# Patient Record
Sex: Female | Born: 1960 | Race: Black or African American | Hispanic: No
Health system: Southern US, Community
[De-identification: ages and names within clinical notes are randomized; demographics above are authoritative.]

---

## 2006-12-24 ENCOUNTER — Ambulatory Visit: Payer: Self-pay | Admitting: Specialist

## 2007-01-28 ENCOUNTER — Ambulatory Visit: Payer: Self-pay | Admitting: Neurosurgery

## 2007-03-04 ENCOUNTER — Ambulatory Visit (HOSPITAL_COMMUNITY): Admission: RE | Admit: 2007-03-04 | Discharge: 2007-03-05 | Payer: Self-pay | Admitting: Neurosurgery

## 2009-05-15 ENCOUNTER — Ambulatory Visit: Payer: Self-pay

## 2010-09-03 ENCOUNTER — Ambulatory Visit: Payer: Self-pay | Admitting: Family Medicine

## 2011-09-02 ENCOUNTER — Emergency Department: Payer: Self-pay | Admitting: Emergency Medicine

## 2012-09-06 ENCOUNTER — Ambulatory Visit: Payer: Self-pay | Admitting: Family Medicine

## 2013-06-02 ENCOUNTER — Ambulatory Visit: Payer: Self-pay | Admitting: Internal Medicine

## 2013-06-20 ENCOUNTER — Inpatient Hospital Stay: Payer: Self-pay | Admitting: Surgery

## 2013-06-20 LAB — COMPREHENSIVE METABOLIC PANEL
Alkaline Phosphatase: 162 U/L — ABNORMAL HIGH (ref 50–136)
BUN: 8 mg/dL (ref 7–18)
Bilirubin,Total: 0.4 mg/dL (ref 0.2–1.0)
Calcium, Total: 9.1 mg/dL (ref 8.5–10.1)
Chloride: 107 mmol/L (ref 98–107)
Co2: 29 mmol/L (ref 21–32)
Creatinine: 0.77 mg/dL (ref 0.60–1.30)
EGFR (Non-African Amer.): >60
Glucose: 86 mg/dL (ref 65–99)
Osmolality: 273 (ref 275–301)

## 2013-06-20 LAB — CBC
MCV: 78 fL — ABNORMAL LOW (ref 80–100)
RBC: 5.13 10*6/uL (ref 3.80–5.20)
RDW: 14.1 % (ref 11.5–14.5)

## 2013-06-20 LAB — URINALYSIS, COMPLETE
Blood: NEGATIVE
RBC,UR: 1 /HPF (ref 0–5)

## 2013-06-20 LAB — LIPASE, BLOOD: Lipase: 128 U/L (ref 73–393)

## 2013-06-21 LAB — CBC WITH DIFFERENTIAL/PLATELET
Basophil #: 0 10*3/uL (ref 0.0–0.1)
Basophil %: 0.4 %
Eosinophil #: 0.2 10*3/uL (ref 0.0–0.7)
HCT: 35.1 % (ref 35.0–47.0)
Lymphocyte %: 32.6 %
MCH: 27.1 pg (ref 26.0–34.0)
Monocyte #: 0.8 x10 3/mm (ref 0.2–0.9)
Monocyte %: 8.7 %
RBC: 4.51 10*6/uL (ref 3.80–5.20)
WBC: 9.5 10*3/uL (ref 3.6–11.0)

## 2013-06-21 LAB — COMPREHENSIVE METABOLIC PANEL
Alkaline Phosphatase: 142 U/L — ABNORMAL HIGH (ref 50–136)
Bilirubin,Total: 0.4 mg/dL (ref 0.2–1.0)
EGFR (African American): >60
Glucose: 81 mg/dL (ref 65–99)
Potassium: 3.6 mmol/L (ref 3.5–5.1)
SGOT(AST): 20 U/L (ref 15–37)
SGPT (ALT): 18 U/L (ref 12–78)
Total Protein: 6.7 g/dL (ref 6.4–8.2)

## 2013-06-22 LAB — COMPREHENSIVE METABOLIC PANEL
Alkaline Phosphatase: 138 U/L — ABNORMAL HIGH (ref 50–136)
BUN: 5 mg/dL — ABNORMAL LOW (ref 7–18)
Bilirubin,Total: 0.5 mg/dL (ref 0.2–1.0)
Chloride: 102 mmol/L (ref 98–107)
Co2: 29 mmol/L (ref 21–32)
EGFR (Non-African Amer.): >60
Osmolality: 272 (ref 275–301)
Potassium: 3.6 mmol/L (ref 3.5–5.1)
SGPT (ALT): 32 U/L (ref 12–78)
Sodium: 138 mmol/L (ref 136–145)
Total Protein: 6.9 g/dL (ref 6.4–8.2)

## 2013-06-22 LAB — CBC WITH DIFFERENTIAL/PLATELET
Basophil #: 0.1 10*3/uL (ref 0.0–0.1)
Basophil %: 0.5 %
Eosinophil %: 0.6 %
HCT: 36.6 % (ref 35.0–47.0)
MCH: 27 pg (ref 26.0–34.0)
MCHC: 34.8 g/dL (ref 32.0–36.0)
MCV: 77 fL — ABNORMAL LOW (ref 80–100)
Monocyte #: 1.1 x10 3/mm — ABNORMAL HIGH (ref 0.2–0.9)
Monocyte %: 10.4 %
Neutrophil #: 6.8 10*3/uL — ABNORMAL HIGH (ref 1.4–6.5)
Platelet: 234 10*3/uL (ref 150–440)
RBC: 4.72 10*6/uL (ref 3.80–5.20)

## 2013-06-23 LAB — COMPREHENSIVE METABOLIC PANEL
Albumin: 2.5 g/dL — ABNORMAL LOW (ref 3.4–5.0)
Anion Gap: 9 (ref 7–16)
BUN: 6 mg/dL — ABNORMAL LOW (ref 7–18)
Co2: 26 mmol/L (ref 21–32)
Glucose: 81 mg/dL (ref 65–99)
Potassium: 3.4 mmol/L — ABNORMAL LOW (ref 3.5–5.1)
SGOT(AST): 31 U/L (ref 15–37)
SGPT (ALT): 28 U/L (ref 12–78)
Sodium: 135 mmol/L — ABNORMAL LOW (ref 136–145)

## 2013-06-23 LAB — CBC WITH DIFFERENTIAL/PLATELET
HGB: 11.5 g/dL — ABNORMAL LOW (ref 12.0–16.0)
MCH: 26.9 pg (ref 26.0–34.0)
Neutrophil %: 69.3 %
RBC: 4.27 10*6/uL (ref 3.80–5.20)
WBC: 11.8 10*3/uL — ABNORMAL HIGH (ref 3.6–11.0)

## 2013-07-03 ENCOUNTER — Ambulatory Visit: Payer: Self-pay | Admitting: Internal Medicine

## 2013-07-04 ENCOUNTER — Ambulatory Visit: Payer: Self-pay | Admitting: Internal Medicine

## 2013-07-04 LAB — CBC CANCER CENTER
Basophil %: 1.2 %
Eosinophil #: 0.3 x10 3/mm (ref 0.0–0.7)
Eosinophil %: 2.4 %
Lymphocyte %: 26.6 %
MCHC: 34.4 g/dL (ref 32.0–36.0)
MCV: 77 fL — ABNORMAL LOW (ref 80–100)
Monocyte #: 0.9 x10 3/mm (ref 0.2–0.9)
Monocyte %: 8.1 %
Neutrophil #: 6.7 x10 3/mm — ABNORMAL HIGH (ref 1.4–6.5)
Neutrophil %: 61.7 %
Platelet: 622 x10 3/mm — ABNORMAL HIGH (ref 150–440)

## 2013-07-04 LAB — COMPREHENSIVE METABOLIC PANEL
Alkaline Phosphatase: 189 U/L — ABNORMAL HIGH (ref 50–136)
Anion Gap: 9 (ref 7–16)
BUN: 7 mg/dL (ref 7–18)
Bilirubin,Total: 0.4 mg/dL (ref 0.2–1.0)
Calcium, Total: 10.4 mg/dL — ABNORMAL HIGH (ref 8.5–10.1)
Chloride: 100 mmol/L (ref 98–107)
Glucose: 98 mg/dL (ref 65–99)
Osmolality: 270 (ref 275–301)
SGOT(AST): 26 U/L (ref 15–37)
Total Protein: 8.8 g/dL — ABNORMAL HIGH (ref 6.4–8.2)

## 2013-07-05 LAB — CANCER ANTIGEN 19-9: CA 19-9: 6 U/mL (ref 0–35)

## 2013-08-02 ENCOUNTER — Ambulatory Visit: Payer: Self-pay | Admitting: Internal Medicine

## 2014-04-22 IMAGING — CT CT ABD-PELV W/ CM
1 of 3 series · 14 of 32 positions shown, 19 images · IV contrast (isovue)
Comparison: None

REASON FOR EXAM: Gallbladder Carcinoma Cholecystectomy
COMMENTS:

PROCEDURE:     KCT - KCT ABDOMEN/PELVIS W  - July 05, 2013 [DATE]
RESULT:     History: Gallbladder carcinoma
TECHNIQUE: Multiple axial images of the abdomen and pelvis were performed
from the lung bases to the pubic symphysis, with p.o. contrast and with 85
ml of Isovue 300 intravenous contrast.

[Series 2: abd 3mm w 3.0 i40f 3 · axial · 0.90mm/px · z∈[-397,-22]mm · 14 of 141 slices shown, 19 images]
[im 8/141  soft-tissue]
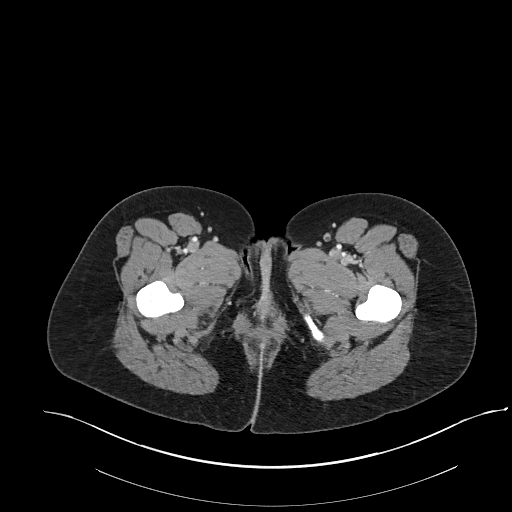
[im 8/141  bone]
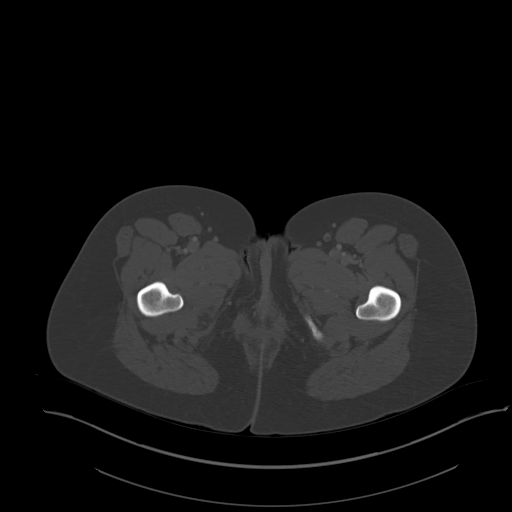
[im 23/141  soft-tissue]
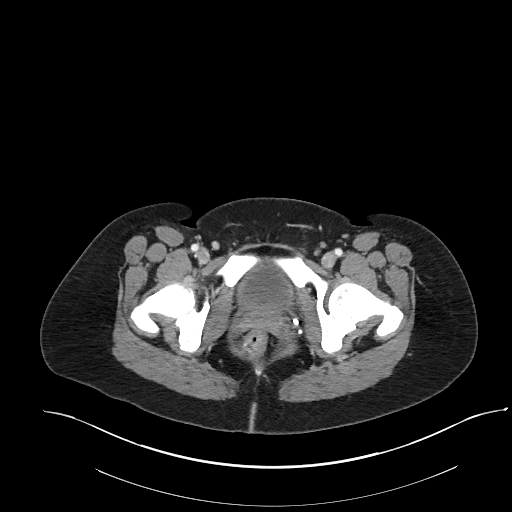
[im 30/141  soft-tissue]
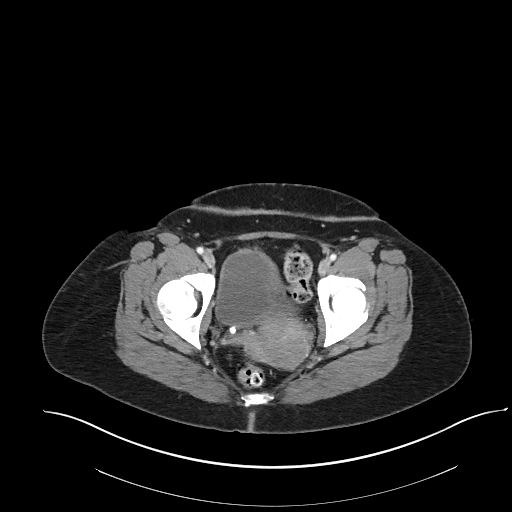
[im 37/141  soft-tissue]
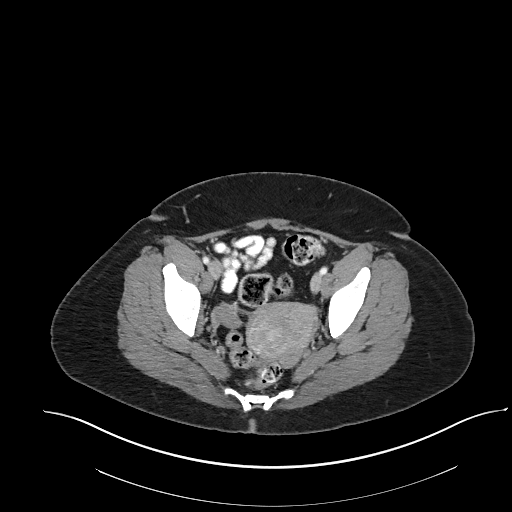
[im 52/141  soft-tissue]
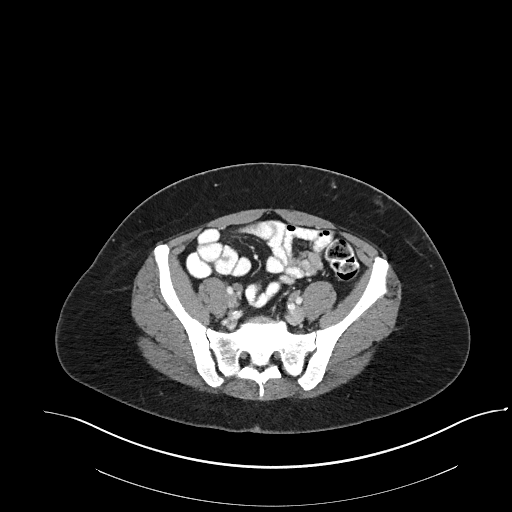
[im 59/141  soft-tissue]
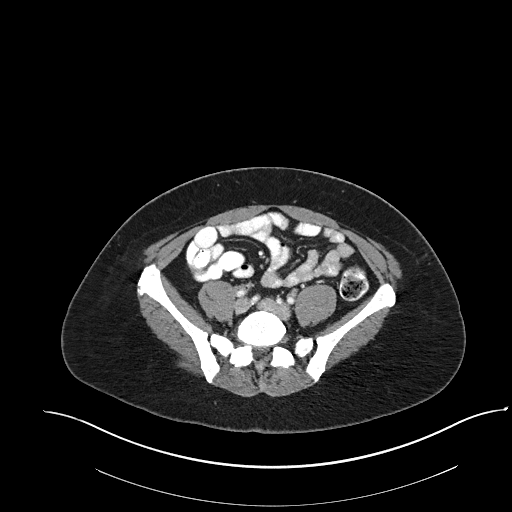
[im 74/141  soft-tissue]
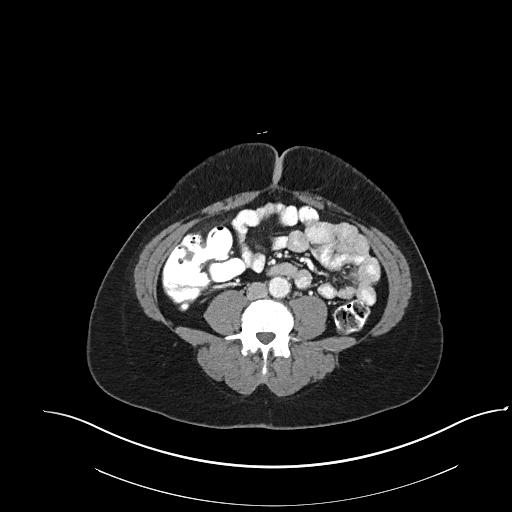
[im 82/141  soft-tissue]
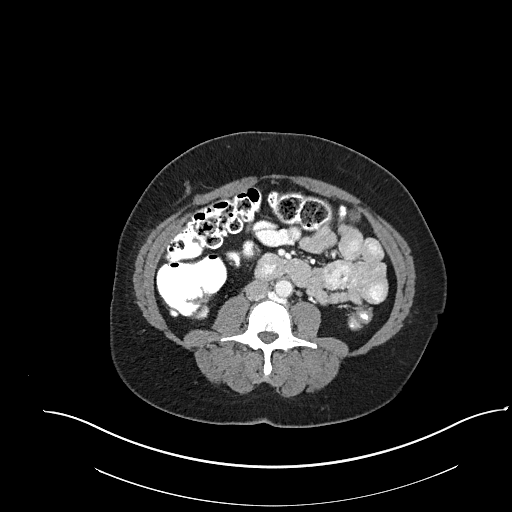
[im 89/141  soft-tissue]
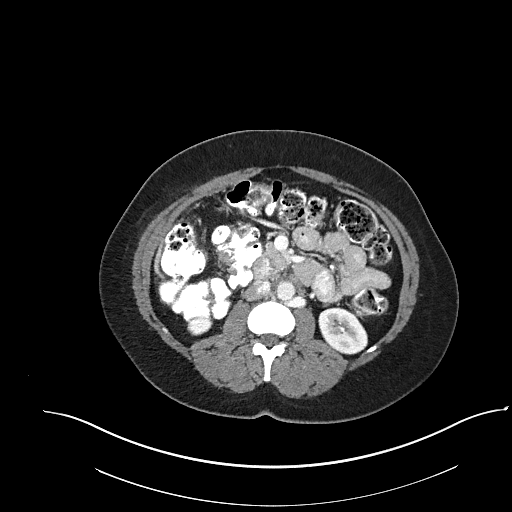
[im 89/141  bone]
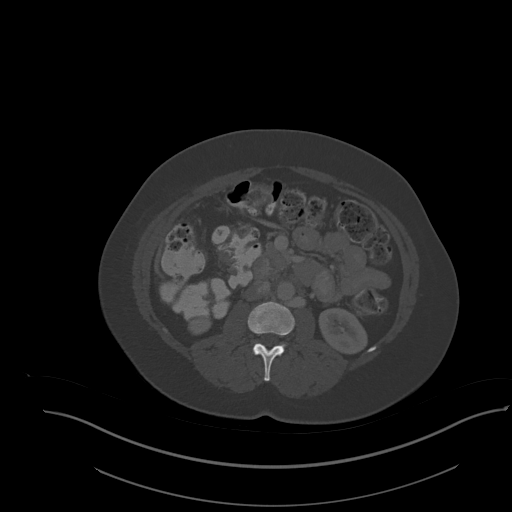
[im 104/141  soft-tissue]
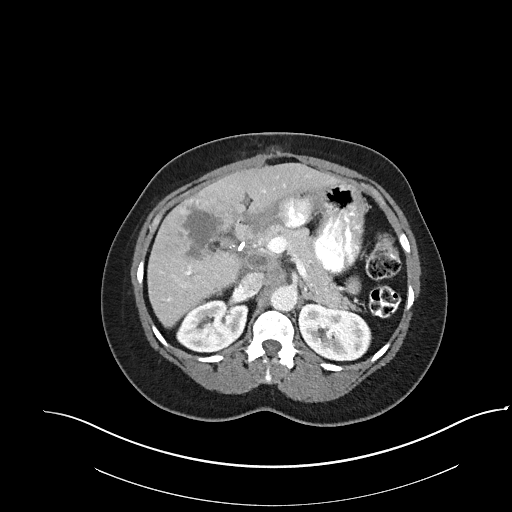
[im 111/141  soft-tissue]
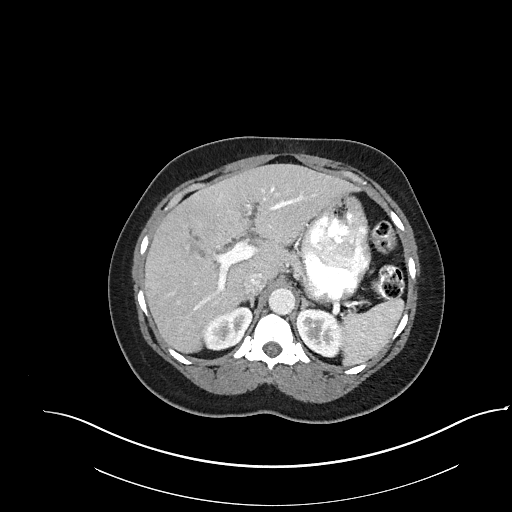
[im 111/141  lung]
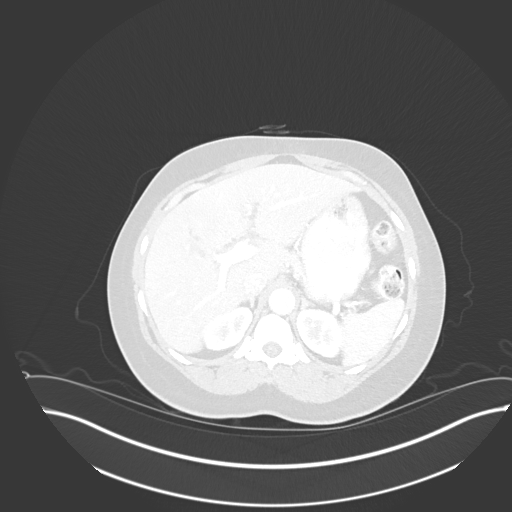
[im 118/141  soft-tissue]
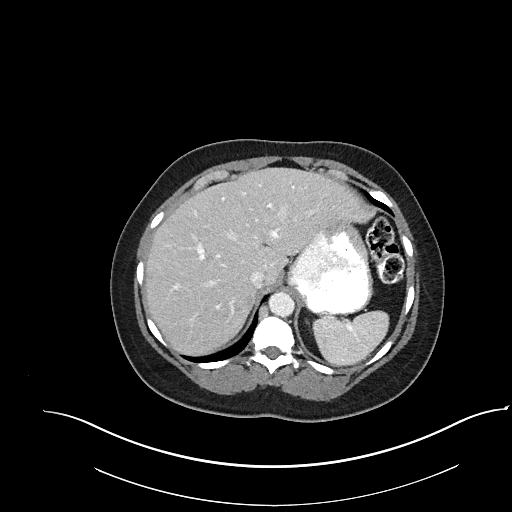
[im 118/141  lung]
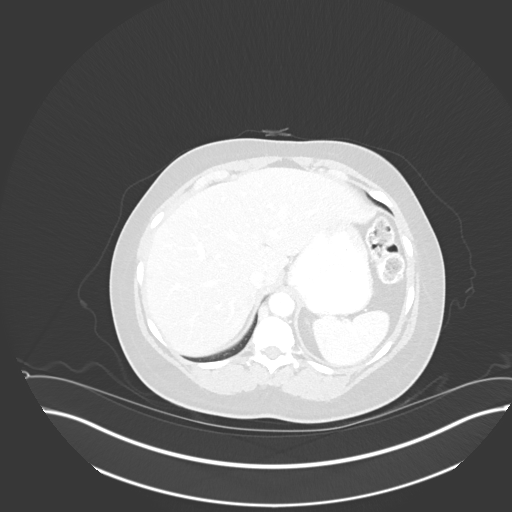
[im 126/141  lung]
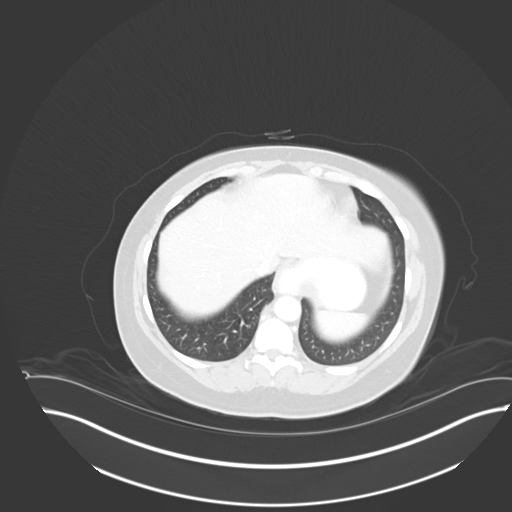
[im 133/141  soft-tissue]
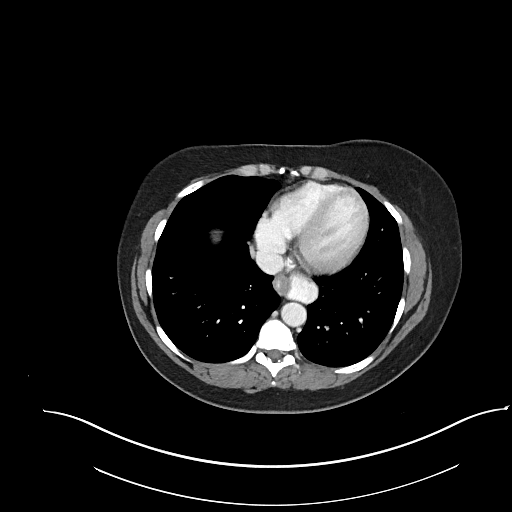
[im 133/141  lung]
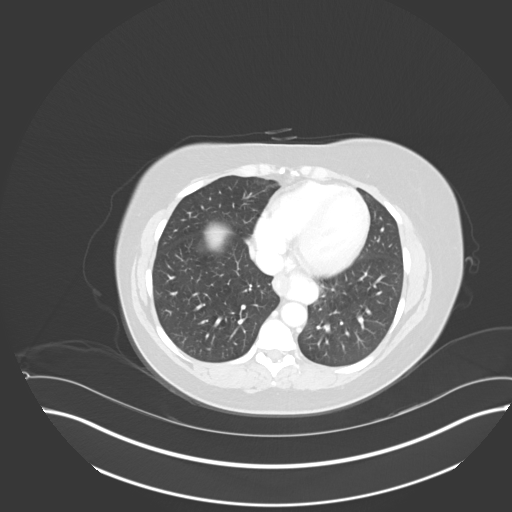

[14 of 32 positions shown; findings below may reference images not displayed]

FINDINGS: The lung bases are clear. There is no pneumothorax. The heart size is
normal.

There is no intrahepatic or extrahepatic biliary ductal dilatation. The
gallbladder is surgically absent. There is a irregular hypodense mass in the
right hepatic lobe adjacent to the gallbladder fossa measuring 4.9 x 3.4 cm
concerning for malignancy versus postoperative fluid collection. The spleen
demonstrates no focal abnormality. There is a 2.4 cm fluid collection along
the lateral aspect of the antrum of the stomach. The kidneys, adrenal
glands, and pancreas are normal. The bladder is unremarkable.

The stomach, duodenum, small intestine, and large intestine demonstrate no
contrast extravasation or dilatation.  There is no pneumoperitoneum,
pneumatosis, or portal venous gas. There is no abdominal or pelvic free
fluid. There is right para-aortic lymphadenopathy with the largest measuring
18 mm in short axis.

The abdominal aorta is normal in caliber with atherosclerosis.

The osseous structures are unremarkable.
IMPRESSION: 1. There is a irregular hypodense mass in the right hepatic lobe adjacent to
the gallbladder fossa measuring 4.9 x 3.4 cm concerning for malignancy
versus postoperative fluid collection. The spleen demonstrates no focal
abnormality. There is a 2.4 cm hypodense masslike area along the lateral
aspect of the antrum of the stomach which may represent a postoperative
fluid collection versus mass. There is right para-aortic lymphadenopathy
with the largest measuring 18 mm in short axis.

[REDACTED]

## 2014-07-03 DEATH — deceased

## 2015-02-22 NOTE — Discharge Summary (Signed)
PATIENT NAME:  Danielle MartinsOLIVER, Davie A MR#:  [id number] DATE OF BIRTH:  November 29, 1960  DATE OF ADMISSION:  06/20/2013 DATE OF DISCHARGE:  06/27/2013  DIAGNOSES:  Acute cholecystitis, cholelithiasis and gallbladder cancer.   PROCEDURES:  Laparoscopic cholecystectomy.   For complete details of discharge see previous discharge summary dictated on 06/25/2013 dictated as an interim discharge summary.    The patient subsequently made an uncomplicated postoperative recovery.  The details are in the original discharge summary.  She stayed an extra day and a half while advancing diet and was discharged in stable condition with no changes to the previous discharge summary.     ____________________________ Adah Salvageichard E. Excell Seltzerooper, MD rec:ea D: 07/06/2013 00:16:28 ET T: 07/06/2013 01:31:27 ET JOB#: [id number]  cc: Adah Salvageichard E. Excell Seltzerooper, MD, <Dictator> Lattie HawICHARD E COOPER MD ELECTRONICALLY SIGNED 07/06/2013 3:55

## 2015-02-22 NOTE — Consult Note (Signed)
PATIENT NAME:  Danielle [name], ANDRINGA MR#:  [id number] DATE OF BIRTH:  07-16-1961  DATE OF CONSULTATION:  06/23/2013  REFERRING PHYSICIAN:  Dr. Dionne Milo  CONSULTING PHYSICIAN:  Keyvon Herter R. Sherrlyn Hock, MD  REASON FOR CONSULTATION: Gallbladder cancer found in cholecystectomy with residual disease involving liver and transverse colon.   HISTORY OF PRESENT ILLNESS: The patient is a 54 year old African American female who was admitted to the hospital on 08/19 with a right upper quadrant abdominal pain, nausea and vomiting. Past medical history, otherwise is significant only for back surgery. The patient otherwise states that she has been physically active and ambulatory. She had ultrasound abdomen limited study done on 08/19 which showed cholelithiasis with findings concerning for acute cholecystitis. She has undergone laparoscopy cholecystectomy on 08/20 by Dr. Excell Seltzer. 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 Per discussion with Dr. Excell Seltzer, he feels that the patient likely has residual disease in the liver and also possibly involving the transverse colon. The patient currently states that she has post surgical pain. Otherwise tolerating liquid diet. Denies any other new cough, shortness of breath, hemoptysis or chest pain. No new bone pains. Denies any other history of malignancy in the past.   PAST MEDICAL/SURGICAL HISTORY: As in history of present illness above.   FAMILY HISTORY: Denies malignancy, states that multiple family members had their gallbladder removed.   SOCIAL HISTORY: History of smoking present. Denies alcohol or recreational drug usage. She is employed in [employer].   REVIEW OF SYSTEMS: CONSTITUTIONAL: As in HPI, currently no fevers or chills. No night sweats.  HEENT: Denies headaches, dizziness, epistaxis, ear or jaw pain.  CARDIAC: No angina,  palpitation, orthopnea, or PND.  LUNGS: Denies any progressive shortness of breath, cough, hemoptysis or chest pain.  GASTROINTESTINAL: As in HPI above. No bright red blood in stools or melena.   GENITOURINARY: No dysuria or hematuria.  SKIN: No new rashes or pruritus.  HEMATOLOGIC: No obvious bleeding issues.  MUSCULOSKELETAL: No new bone pains. Has some chronic low back pain which is under control.  EXTREMITIES: No new swelling or pain.  NEUROLOGIC: No new focal weakness, seizures or loss of consciousness.  ENDOCRINE: No polyuria or polydipsia. Appetite has been steady.   PHYSICAL EXAMINATION: GENERAL: The patient is a moderately built and nourished individual resting in bed, alert and oriented x4 and converses appropriately. No icterus or pallor.  VITAL SIGNS: 98, 61, 16, 145/75, 93% on 2 liters oxygen.  HEENT: Normocephalic, atraumatic. Extraocular movements intact. Sclerae anicteric. No oral thrush.  NECK: Negative for lymphadenopathy.  CARDIOVASCULAR: S1, S2, regular rate and rhythm.  LUNGS: Lungs show bilateral good air entry, no crepitations or rhonchi.  ABDOMEN: Soft, nondistended, mild tenderness in epigastrium and right upper quadrant. Bowel sounds present. No masses palpable clinically.  EXTREMITIES: No major edema or cyanosis.  SKIN: No new rashes of major bruising.  MUSCULOSKELETAL: No obvious joint swelling or deformity.  NEUROLOGIC: Nonfocal, cranial nerves intact.   LABORATORY RESULTS: Hemoglobin on 11.5, platelets 218 WBC 11,800, ANC 8200, creatinine 0.94, calcium 8.7. Liver functions within normal limits except albumin of 2.5.   IMPRESSION AND RECOMMENDATIONS: A 54 year old female patient who has undergone laparoscopy cholecystectomy on 06/21/2013 and incidentally found to have adenocarcinoma of the gallbladder felt to be at least stage pT3, however, per discussion with surgeon, Dr. Excell Seltzer he feels that there might be involvement of transverse colon as well as  residual  disease in the liver. The patient was explained about incidental finding of malignancy in the gallbladder. Once she recovers in the immediate postoperative period, she will then need CT scan evaluation to try and delineate extent of residual disease and also look for any regional lymphadenopathy or metastases. She will then need evaluation by GI oncology surgeon, will get her to meet Dr. Madaline BrilliantMosca as outpatient once CT scan is done. Oral intake otherwise seems to be slowly improving. The patient also has good performance status and has been in fairly good general health up until this admission. The patient also explained that overall prognosis and resectability will depend upon the extent and stage of malignancy. She is agreeable to above plan. Will continue to follow.   Thank you for the referral. Please do not hesitate to contact me if you have any additional questions.   ____________________________ Maren ReamerSandeep R. Sherrlyn HockPandit, MD srp:dp D: 06/24/2013 10:40:00 ET T: 06/24/2013 11:16:28 ET JOB#: [id number]  cc: Maiyah Goyne R. Sherrlyn HockPandit, MD, <Dictator> Wille CelesteSANDEEP R Yaritzel Stange MD ELECTRONICALLY SIGNED 06/24/2013 12:40

## 2015-02-22 NOTE — H&P (Signed)
Subjective/Chief Complaint ruq pain   History of Present Illness 1 week pain ,nausea, emesis, fevers no jaundice constipation   Past History PMH none PSH back surgery   Past Med/Surgical Hx:  denies med hx:   back surg:   ALLERGIES:  No Known Allergies:   Family and Social History:  Family History Non-Contributory   Social History positive  tobacco, negative ETOH, manufacturing   + Tobacco Current (within 1 year)   Place of Living Home   Review of Systems:  Fever/Chills No   Cough No   Sputum No   Abdominal Pain Yes   Diarrhea No   Constipation Yes   Nausea/Vomiting Yes   SOB/DOE No   Chest Pain No   Dysuria No   Tolerating Diet No  Nauseated   Physical Exam:  GEN no acute distress   HEENT pink conjunctivae   NECK supple  No masses   RESP normal resp effort  clear BS   CARD regular rate   ABD positive tenderness  soft  pos Murphy's   LYMPH negative neck   EXTR negative edema   SKIN normal to palpation   PSYCH alert, A+O to time, place, person, good insight   Lab Results: Hepatic:  19-Aug-14 08:33   Bilirubin, Total 0.4  Alkaline Phosphatase  162  SGPT (ALT) 25  SGOT (AST) 25  Total Protein, Serum 8.1  Albumin, Serum 3.5  Routine Chem:  19-Aug-14 08:33   Glucose, Serum 86  BUN 8  Creatinine (comp) 0.77  Sodium, Serum 138  Potassium, Serum 3.7  Chloride, Serum 107  CO2, Serum 29  Calcium (Total), Serum 9.1  Osmolality (calc) 273  eGFR (African American) >60  eGFR (Non-African American) >60 (eGFR values <47m/min/1.73 m2 may be an indication of chronic kidney disease (CKD). Calculated eGFR is useful in patients with stable renal function. The eGFR calculation will not be reliable in acutely ill patients when serum creatinine is changing rapidly. It is not useful in  patients on dialysis. The eGFR calculation may not be applicable to patients at the low and high extremes of body sizes, pregnant women, and vegetarians.)   Anion Gap  2  Lipase 128 (Result(s) reported on 20 Jun 2013 at 09:32AM.)  Routine UA:  19-Aug-14 08:33   Color (UA) Yellow  Clarity (UA) Clear  Glucose (UA) Negative  Bilirubin (UA) Negative  Ketones (UA) Negative  Specific Gravity (UA) 1.014  Blood (UA) Negative  pH (UA) 6.0  Protein (UA) Negative  Nitrite (UA) Negative  Leukocyte Esterase (UA) Negative (Result(s) reported on 20 Jun 2013 at 09:31AM.)  RBC (UA) 1 /HPF  WBC (UA) 1 /HPF  Bacteria (UA) NONE SEEN  Epithelial Cells (UA) 4 /HPF  Mucous (UA) PRESENT (Result(s) reported on 20 Jun 2013 at 09:31AM.)  Routine Hem:  19-Aug-14 08:33   WBC (CBC) 9.6  RBC (CBC) 5.13  Hemoglobin (CBC) 13.8  Hematocrit (CBC) 39.9  Platelet Count (CBC) 273 (Result(s) reported on 20 Jun 2013 at 09:37AM.)  MCV  78  MCH 27.0  MCHC 34.7  RDW 14.1   Radiology Results: [place]    19-Aug-14 10:56, [place]  [place]  REASON FOR EXAM:    RUQ pain  COMMENTS:   Body Site: GB and Fossa, CBD, Head of Pancreas    PROCEDURE: [place]  - Jun 20 2013 10:56AM     RESULT: Limited abdominal ultrasound is performed emergently. The   visualized  pancreas appears normal. The head and tail were not completely   seen. The liver demonstrates grossly normal echotexture without a focal   mass or evidence of ductal dilation. Liver length is normal at 14.08 cm.   A portal venous flow is normal. Common bile duct diameter measures up to   5.1 mm. This is normal for the patient's age. The gallbladder wall is   abnormal in appearance. There multiple shadowing stones filling the   gallbladder. There is a positive sonographic Murphy's sign. The   gallbladder wall thickness is 1.43 cm. No ascites is appreciated.  IMPRESSION:   1. Cholelithiasis with findings concerning for acute cholecystitis.   Surgical consultation is recommended. Incomplete visualization of the   pancreas.    Dictation Site:  2        Verified By: Sundra Aland, M.D., MD    Assessment/Admission Diagnosis acute chole admit hydrate IV abx lap chole risks options in detail   Electronic Signatures: Florene Glen (MD)  (Signed 19-Aug-14 14:03)  Authored: CHIEF COMPLAINT and HISTORY, PAST MEDICAL/SURGIAL HISTORY, ALLERGIES, FAMILY AND SOCIAL HISTORY, REVIEW OF SYSTEMS, PHYSICAL EXAM, LABS, Radiology, ASSESSMENT AND PLAN   Last Updated: 19-Aug-14 14:03 by Florene Glen (MD)

## 2015-02-22 NOTE — Op Note (Signed)
PATIENT NAME:  Danielle [name], Danielle [name] MR#:  [id number] DATE OF BIRTH:  02-01-1961  DATE OF PROCEDURE:  06/21/2013  PREOPERATIVE DIAGNOSIS: Acute cholecystitis.   POSTOPERATIVE DIAGNOSIS: Acute cholecystitis with early colovesical fistula.   PROCEDURE:  Laparoscopic cholecystectomy.   SURGEON: Dionne Milo, M.D.   ANESTHESIA: General with endotracheal tube.   INDICATIONS: This is a patient with acute cholecystitis and gallstones. Preoperatively we discussed rationale for surgery, the options of observation, risk of bleeding, infection, recurrence of symptoms, failure to resolve her symptoms, open procedure, bile duct damage, bile duct leak,  bowel injury; any of which could require further surgery and/or ERCP, stent and papillotomy. This was all reviewed for her in the preop holding area. She understood and agreed to proceed.   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 The fundus of the gallbladder was densely adherent via chronic inflammatory process to the colon. The duodenum was also quite adherent, but of a more acute nature to the area of the infundibulum of the gallbladder.  No luminal rent could be identified following the procedure, multiple gallstones.   DESCRIPTION OF PROCEDURE: The patient was induced under general anesthesia. She was on IV antibiotics and VTE prophylaxis was in place. She was prepped and draped in a sterile fashion. Marcaine was infiltrated in the skin and subcutaneous tissues around the periumbilical area. Incision was made. Veress needle was placed. Pneumoperitoneum was obtained and a 5 mm trocar port was placed. The abdominal cavity was explored and under direct vision, a 10 mm epigastric port was placed and two lateral 5 mm ports were placed. The gallbladder was identified, but only the fundus could be identified. It was greatly adherent to the colon. Careful blunt and sharp dissection without the use of energy was  performed to dissect away the colon from the fundus of the gallbladder. Once this was performed, the gallbladder was placed on tension. Further dissection down towards the infundibulum was performed. Some small vessels were doubly clipped and divided. The cystic duct was well identified after taking down the adhesions of the duodenum to what appeared to be the common hepatic duct. Further dissection demonstrated that the cystic duct entered the common hepatic in the usual fashion and at this point the cystic duct was doubly clipped and divided at the gallbladder and inspected and it was truly found to be the cystic duct. The gallbladder was taken from the gallbladder fossa with blunt dissection. No electrocautery was utilized through the entire case.   Stones were retrieved from the fundus area of the gallbladder, and because of the intense inflammatory process, the back wall of the gallbladder was left in place. Through the lateral port site was placed a 10 mm JP drain tied in with 3-0 nylon. Extensive irrigation was performed. There was no sign of bowel injury, bile leak or bleeding at this point. The camera was placed in the epigastric site to take another view at the area with 30 degree scope. Also, there were no adhesions in the area of the periumbilical site. No sign of bowel injury. At this point, there was no sign of bleeding, bile leak or bowel injury. Therefore, pneumoperitoneum was released. All ports were removed. The fascial edges at the epigastric site were approximated with 0 Vicryl (the gallbladder had been taken out through the epigastric site, which needed to be enlarged due to the multiple large gallstones and this was closed with figure-of-eight 0 Vicryl)   The drain was placed to  bulb suction and skin edges were approximated with 4-0 subcuticular Monocryl. Steri-Strips, Mastisol, and sterile dressings were placed.   The patient tolerated this procedure well. There were no complications.  She was taken to the recovery room in stable condition to be admitted for continued care.     ____________________________ Adah Salvageichard E. Excell Seltzerooper, [name] rec:cc D: 06/21/2013 13:06:40 ET T: 06/21/2013 14:37:59 ET JOB#: [id number]  cc: Adah Salvageichard E. Excell Seltzerooper, [name], <Dictator> Danielle [name] ELECTRONICALLY SIGNED 06/22/2013 10:46

## 2015-02-22 NOTE — H&P (Signed)
PATIENT NAME:  Danielle [name], Danielle [name] MR#:  [id number] DATE OF BIRTH:  1961/08/22  DATE OF ADMISSION:  06/20/2013  DATE OF ADMISSION: Right upper quadrant pain.   HISTORY OF PRESENT ILLNESS: This is [name] patient with one week history of abdominal pain in the right upper quadrant and epigastrium that radiates through to her back. She has had one episode like this before. It was much less symptomatic, but that was about [name] year ago and spontaneously resolved. She was in the ER now for history of abdominal pain suggestive of acute cholecystitis with [name] work-up that confirms that.   She has nausea. No emesis today, but vomited it yesterday and the day before. She has had normal bowel movements without jaundice or acholic stools.   PAST MEDICAL HISTORY: None.   PAST SURGICAL HISTORY: Back surgery.   ALLERGIES: None.   MEDICATIONS: None.   FAMILY HISTORY: Multiple family members have had their gallbladder removed.   REVIEW OF SYSTEMS:  [name] 10-system review was performed and negative with the exception of that mentioned in the history of present illness.    SOCIAL HISTORY: The patient smokes tobacco, is employed [employer]. Does not drink alcohol.   PHYSICAL EXAMINATION: GENERAL: Healthy-appearing female patient.  NECK: No palpable neck nodes.  CHEST: Clear to auscultation.  CARDIAC: Regular rate and rhythm.  ABDOMEN: Soft, tenderness in the right upper quadrant with [name] positive Murphy's sign.   EXTREMITIES: Without edema. Calves are nontender.   NEUROLOGIC: Grossly intact.   INTEGUMENT: No jaundice.   LABORATORY, DIAGNOSTIC AND RADIOLOGIC DATA:  Laboratory values demonstrate normal liver function tests. White blood cell count is 9.6, hemoglobin and hematocrit of 13.8 and 40 and ultrasound shows gallstones and probable acute cholecystitis.   ASSESSMENT AND PLAN: This is [name] patient with symptomatic cholelithiasis and probable acute cholecystitis. I have recommended admission, hydration and IV  antibiotics and plan laparoscopic cholecystectomy when able. The rationale for this approach has been discussed. The options of observation have been reviewed and the risks of bleeding, infection, recurrence of symptoms, bile duct damage, bile duct leak, retained common bile duct stone, any of which could require further surgery including open procedure ERCP, stent, and papillotomy. This was reviewed for her and her family. They understood and agreed to proceed.   ____________________________ Adah Salvageichard E. Excell Seltzerooper, MD rec:cc D: 06/20/2013 17:02:15 ET T: 06/20/2013 17:24:28 ET JOB#: [id number]  cc: Adah Salvageichard E. Excell Seltzerooper, MD, <Dictator> Lattie HawICHARD E Adrean Heitz MD ELECTRONICALLY SIGNED 06/20/2013 18:31

## 2015-02-22 NOTE — Discharge Summary (Signed)
PATIENT NAME:  Danielle Danielle [name], Danielle Danielle [name] MR#:  [id number] DATE OF BIRTH:  08/31/1961  DATE OF ADMISSION:  06/20/2013  DATE OF DISCHARGE:  06/25/2013  DIAGNOSES:  1.  Acute cholecystitis. 2.  Cholelithiasis. 3.  Gallbladder cancer.   PROCEDURE:  Laparoscopic cholecystectomy.   HISTORY OF PRESENT ILLNESS AND HOSPITAL COURSE:  This is Danielle [name] patient with one week of abdominal pain suggestive of acute cholecystitis. Workup in the Emergency Room confirmed that, and she was taken to the Operating Room, where severe, dense, inflammatory and scarified tissue was found at the region of the gallbladder and transverse colon. The transverse colon was adherent to the gallbladder. It was taken down bluntly, without complication. The back wall of the gallbladder was left in place, as it was intensely inflamed and densely adherent to the liver, and very thickened. The fundus of the gallbladder seemed to be the most involved and, in fact, the infundibulum of the gallbladder seemed normal. There was intense inflammatory response around the duodenum to the area just above the infundibulum of the gallbladder, but did not seem to be acutely inflamed at the infundibulum and cystic duct.   Postoperatively, the patient did well. Danielle [name] drain had been placed, and did not show any sign of bile or stool to suggest Danielle [name], and the patient was treated with antibiotics for presumed acute cholecystitis. The pathology report, however, came back as gallbladder cancer. When I spoke to the pathologist initially, they could not tell if this was gallbladder or colon, but in my estimation it likely started in the gallbladder as opposed to the colon, as the colon was inflamed, but there did not seem to be Danielle [name] palpable mass there.   I discussed this with the patient, and I asked Dr. Sherrlyn HockPandit to see the patient as well, who has performed Danielle [name] consultation.   It is my belief that this patient will likely require exploration and possible liver resection and  transverse colon resection as well. Additional imaging studies may be performed prior to sending the patient to [facility] for surgical oncology or hepatobiliary consultation for additional surgery. This was discussed with the patient and her sister and Dr. Sherrlyn HockPandit. The patient will be discharged either today or tomorrow, depending on her tolerance of Danielle [name] regular diet, which she has been increasing. She will be discharged in stable condition on oral analgesics and instructions to shower. Her drain will be removed prior to discharge, and she will follow up with me this week and with Dr. Sherrlyn HockPandit in 1 to 2 weeks as well.     ____________________________ Adah Salvageichard E. Excell Seltzerooper, MD rec:mr D: 06/25/2013 10:55:00 ET T: 06/25/2013 18:57:03 ET JOB#: [id number]  cc: Adah Salvageichard E. Excell Seltzerooper, MD, <Dictator> Lattie HawICHARD E Cire Deyarmin MD ELECTRONICALLY SIGNED 06/29/2013 9:34
